# Patient Record
Sex: Male | Born: 1965 | Race: White | Hispanic: No | Marital: Married | Smoking: Former smoker
Health system: Southern US, Community
[De-identification: ages and names within clinical notes are randomized; demographics above are authoritative.]

## PROBLEM LIST (undated history)

## (undated) DIAGNOSIS — IMO0002 Reserved for concepts with insufficient information to code with codable children: Secondary | ICD-10-CM

## (undated) DIAGNOSIS — R030 Elevated blood-pressure reading, without diagnosis of hypertension: Secondary | ICD-10-CM

## (undated) DIAGNOSIS — S92309A Fracture of unspecified metatarsal bone(s), unspecified foot, initial encounter for closed fracture: Secondary | ICD-10-CM

## (undated) DIAGNOSIS — M419 Scoliosis, unspecified: Secondary | ICD-10-CM

## (undated) DIAGNOSIS — L0591 Pilonidal cyst without abscess: Secondary | ICD-10-CM

## (undated) HISTORY — DX: Reserved for concepts with insufficient information to code with codable children: IMO0002

## (undated) HISTORY — DX: Pilonidal cyst without abscess: L05.91

## (undated) HISTORY — DX: Scoliosis, unspecified: M41.9

## (undated) HISTORY — DX: Elevated blood-pressure reading, without diagnosis of hypertension: R03.0

## (undated) HISTORY — DX: Fracture of unspecified metatarsal bone(s), unspecified foot, initial encounter for closed fracture: S92.309A

---

## 2001-12-21 ENCOUNTER — Emergency Department (HOSPITAL_COMMUNITY): Admission: EM | Admit: 2001-12-21 | Discharge: 2001-12-21 | Payer: Self-pay | Admitting: Emergency Medicine

## 2006-05-11 LAB — HM HIV SCREENING LAB: HM HIV Screening: NEGATIVE

## 2006-05-11 LAB — HM HEPATITIS C SCREENING LAB: HM Hepatitis Screen: NEGATIVE

## 2010-08-28 ENCOUNTER — Ambulatory Visit (INDEPENDENT_AMBULATORY_CARE_PROVIDER_SITE_OTHER): Payer: BC Managed Care – PPO | Admitting: Family Medicine

## 2010-08-28 VITALS — BP 122/80 | HR 88 | Temp 98.1°F | Ht 70.0 in | Wt 229.1 lb

## 2010-08-28 DIAGNOSIS — R030 Elevated blood-pressure reading, without diagnosis of hypertension: Secondary | ICD-10-CM

## 2010-08-28 MED ORDER — SCOPOLAMINE 1 MG/3DAYS TD PT72: 1.0000 | MEDICATED_PATCH | TRANSDERMAL | Status: DC

## 2010-08-28 NOTE — Progress Notes (Signed)
Needs to est care here.  Was told BP was high at work- 140/110.  At [facility] ~130/90 and told to work on diet/weight/salt/exercise.  Had labs done at [facility].  Lost 10lbs since this all started.  Occ brief vision changes noted by patient.  "Sometimes it gets blurry."  Some fatigue, but now working 12 hour shifts.  Most of this happened after changing jobs.    Needs patch for motion sickness for fishing trip.  Has used it before w/o complication.   No CP/SOB/BLE edema.  Feeling well o/w.   PMH and SH reviewed  ROS: See HPI, otherwise noncontributory.  Meds, vitals, and allergies reviewed.   GEN: nad, alert and oriented HEENT: mucous membranes moist NECK: supple w/o LA CV: rrr PULM: ctab, no inc wob ABD: soft, +bs EXT: no edema SKIN: no acute rash Fundus wnl, eomi and perrl

## 2010-08-28 NOTE — Patient Instructions (Addendum)
Use the patch for motion sickness. Check the DASH diet for ideas on salt substitution.   Get a cuff to check your BP at home in the mornings.  [employer] makes good cuffs.   I'll get your records and send notice.   If the vision changes continue, then talk to an eye clinic. Take care.

## 2010-08-29 ENCOUNTER — Encounter: Payer: Self-pay | Admitting: Family Medicine

## 2010-08-29 MED ORDER — SCOPOLAMINE 1 MG/3DAYS TD PT72: 1.0000 | MEDICATED_PATCH | TRANSDERMAL | Status: AC

## 2010-08-29 NOTE — Assessment & Plan Note (Signed)
Request records and have patient check on DASH diet.  He doesn't have HTN as of this visit.  He agrees with plan.  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

## 2010-09-11 ENCOUNTER — Telehealth: Payer: Self-pay | Admitting: Family Medicine

## 2010-09-11 NOTE — Telephone Encounter (Signed)
Please call pt.  Records for Donald Kelley came in.  His cholesterol and other labs were okay for now.  Goal is treatment of BP/weight/cholesterol with diet/exercise/weight loss.  The DASH diet we talked about should help.  I would recheck labs next year at a physical.  Let me know if BP stays up in the meantime.  Thanks.

## 2010-09-16 NOTE — Telephone Encounter (Signed)
Patient advised.

## 2011-10-19 ENCOUNTER — Telehealth: Payer: Self-pay | Admitting: Family Medicine

## 2011-10-19 ENCOUNTER — Ambulatory Visit (INDEPENDENT_AMBULATORY_CARE_PROVIDER_SITE_OTHER): Payer: 59 | Admitting: Family Medicine

## 2011-10-19 ENCOUNTER — Encounter: Payer: Self-pay | Admitting: Family Medicine

## 2011-10-19 VITALS — BP 112/70 | HR 60 | Temp 98.6°F | Wt 225.8 lb

## 2011-10-19 DIAGNOSIS — M549 Dorsalgia, unspecified: Secondary | ICD-10-CM | POA: Insufficient documentation

## 2011-10-19 MED ORDER — IBUPROFEN 800 MG PO TABS: 800.0000 mg | ORAL_TABLET | Freq: Three times a day (TID) | ORAL | Status: AC | PRN

## 2011-10-19 MED ORDER — TRAMADOL HCL 50 MG PO TABS: 50.0000 mg | ORAL_TABLET | Freq: Three times a day (TID) | ORAL | Status: AC | PRN

## 2011-10-19 NOTE — Patient Instructions (Signed)
Take ibuprofen and tramadol for pain.  Tramadol can make you drowsy.  Take ibuprofen with food.  This should gradually get better.  Stretch your back as we discussed.

## 2011-10-19 NOTE — Assessment & Plan Note (Signed)
Benign exam w/o radicular sx or weakness.  Would stretch and use ibuprofen/tramadol with GI/sedation cautions.  F/u prn.  No indication to image at this point.

## 2011-10-19 NOTE — Telephone Encounter (Signed)
Caller: Donald Kelley/Patient; PCP: Sunnie Nielsen); CB#: [phone];  Call regarding starting with Back Pain onset 10/17/11 aching before bed and worse when he woke on 10/18/11. Pinching sharp Pain in lower back that radiates to his buttocks and legs with movement. He is not sure what he did to hurt it. Was not able to get in his truck this morning to go to work-10/19/11. Took 800 mgs of ibuprofen and eased pain slightly. Triage and Care advice per Back Sx Protocol and appnt scheduled for 1415 10/19/11 with Dr. Para March.

## 2011-10-19 NOTE — Telephone Encounter (Signed)
Noted  

## 2011-10-19 NOTE — Progress Notes (Signed)
Back pain.  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  Later that night he was sitting in low beach chair. He felt a pinch in his back Saturday night; worse Sunday.  Took 800mg  ibuprofen with some relief.  Out of work today, he had trouble climbing in the truck.  This has happened before, but not recently.   Pain is just below the belt in midline on the back.  Some episodic L leg pain with turning but not as much pain as in the back.  He was better laying down with a pillow between his knees. Worse sitting and standing.  No FCNAVD.  No weakness in legs.  No changes in bowels/bladder.   Meds, vitals, and allergies reviewed.   ROS: See HPI.  Otherwise, noncontributory.  nad ncat Walks slowly due to pain but w/o limp Upper back not ttp Lower back midline is painful but not on palpation.  Paraspinal muscles not ttp Pain with waist flexion and extension No weakness or change in sensation to BLE, DTRs symmetric No rash

## 2012-02-14 ENCOUNTER — Emergency Department: Payer: Self-pay | Admitting: Emergency Medicine

## 2016-02-06 ENCOUNTER — Ambulatory Visit (INDEPENDENT_AMBULATORY_CARE_PROVIDER_SITE_OTHER): Payer: 59 | Admitting: Family Medicine

## 2016-02-06 ENCOUNTER — Encounter: Payer: Self-pay | Admitting: Family Medicine

## 2016-02-06 ENCOUNTER — Ambulatory Visit (INDEPENDENT_AMBULATORY_CARE_PROVIDER_SITE_OTHER)
Admission: RE | Admit: 2016-02-06 | Discharge: 2016-02-06 | Disposition: A | Discharge status: Home / Self Care | Payer: 59 | Source: Ambulatory Visit | Attending: Family Medicine | Admitting: Family Medicine

## 2016-02-06 VITALS — BP 132/86 | HR 64 | Temp 98.4°F | Wt 232.8 lb

## 2016-02-06 DIAGNOSIS — R05 Cough: Secondary | ICD-10-CM | POA: Diagnosis not present

## 2016-02-06 DIAGNOSIS — R0789 Other chest pain: Secondary | ICD-10-CM

## 2016-02-06 DIAGNOSIS — R059 Cough, unspecified: Secondary | ICD-10-CM

## 2016-02-06 MED ORDER — ALBUTEROL SULFATE HFA 108 (90 BASE) MCG/ACT IN AERS: 2.0000 | INHALATION_SPRAY | Freq: Four times a day (QID) | RESPIRATORY_TRACT | 0 refills | Status: DC | PRN

## 2016-02-06 MED ORDER — RANITIDINE HCL 150 MG PO TABS: 150.0000 mg | ORAL_TABLET | Freq: Two times a day (BID) | ORAL | 1 refills | Status: DC

## 2016-02-06 MED ORDER — ALBUTEROL SULFATE (2.5 MG/3ML) 0.083% IN NEBU
2.5000 mg | INHALATION_SOLUTION | Freq: Once | RESPIRATORY_TRACT | Status: AC
  Administered 2016-02-06: 2.5 mg via RESPIRATORY_TRACT

## 2016-02-06 NOTE — Assessment & Plan Note (Signed)
Cough has been going on in the morning for the last few months. This coincides with increasing heartburn. The two may be related. Now with more recent chest tightness. No exertional symptoms. I do not suspect that he has a cardiac process causing the chest tightness. 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  Discussed with patient. He is improved after using albuterol here in the clinic. Start Zantac twice a day. Use albuterol inhaler as needed. Check chest x-ray. See notes on imaging. He will update me as needed. See after visit summary. Okay for outpatient f/u. >30 minutes spent in face to face time with patient, >50% spent in counselling or coordination of care.

## 2016-02-06 NOTE — Patient Instructions (Signed)
Go to the lab on the way out.  We'll contact you with your xray report. Start zantac 150mg  twice a day for heartburn.  Use inhaler as needed.   Update me in a few days.  Take care.  Glad to see you.

## 2016-02-06 NOTE — Progress Notes (Signed)
Pre visit review using our clinic review tool, if applicable. No additional management support is needed unless otherwise documented below in the visit note. 

## 2016-02-06 NOTE — Progress Notes (Signed)
Sx started a few months ago.  Usually with AM cough.  As the morning goes on, he would usually improve.  In the last week or so his sx changed, after a vacation.  Recently with tightness in chest, but not exertional.  No sputum.  No FCNAVD.  He doesn't feel sick.  Some indigestion resolved with TUMS.  He paints cars for a living.  No known wheeze.  Some rhinorrhea.  No ear pain usually unless he valsalvas and that is at baseline over the last few years.  He has no ST.  More dust exposure recently.    Cough worse in the last few months, concurrently with inc in GERD/heartburn.   PMH and SH reviewed  ROS: Per HPI unless specifically indicated in ROS section   Meds, vitals, and allergies reviewed.   GEN: nad, alert and oriented HEENT: mucous membranes moist, tm w/o erythema, nasal exam w/o erythema, clear discharge noted,  OP with cobblestoning NECK: supple w/o LA CV: rrr.   PULM: ctab, no inc wob EXT: no edema SKIN: no acute rash  Recheck after SABA use: no wheeze, ctab, and chest doesn't feel tight.

## 2016-02-07 ENCOUNTER — Telehealth: Payer: Self-pay | Admitting: Family Medicine

## 2016-02-07 NOTE — Telephone Encounter (Signed)
Pt returned call - he states to leave a detailed message, as he is hard to get a hold of.

## 2016-02-07 NOTE — Telephone Encounter (Signed)
Refer to lab notes. Thank you.

## 2016-04-15 ENCOUNTER — Other Ambulatory Visit: Payer: Self-pay | Admitting: Family Medicine

## 2016-09-21 ENCOUNTER — Other Ambulatory Visit: Payer: Self-pay

## 2016-09-21 NOTE — Telephone Encounter (Signed)
Pt is going on deep [place] fishing trip starting 09/22/16 for one week. Pt request refill transderm scope to [facility]. Pt last seen 01/27/16 and rx last refilled # 4 x 1 2012.

## 2016-09-22 MED ORDER — SCOPOLAMINE 1 MG/3DAYS TD PT72: 1.0000 | MEDICATED_PATCH | TRANSDERMAL | 1 refills | Status: DC

## 2016-09-22 NOTE — Telephone Encounter (Signed)
Left message on voicemail for patient to call back. 

## 2016-09-22 NOTE — Telephone Encounter (Signed)
Patient notified by telephone that script has been sent to the pharmacy. 

## 2016-09-22 NOTE — Telephone Encounter (Signed)
Sent. Thanks.   

## 2017-02-10 ENCOUNTER — Encounter: Payer: Self-pay | Admitting: Family Medicine

## 2017-02-10 ENCOUNTER — Ambulatory Visit (INDEPENDENT_AMBULATORY_CARE_PROVIDER_SITE_OTHER): Payer: 59 | Admitting: Family Medicine

## 2017-02-10 VITALS — BP 142/88 | HR 67 | Temp 98.2°F | Wt 234.0 lb

## 2017-02-10 DIAGNOSIS — K30 Functional dyspepsia: Secondary | ICD-10-CM | POA: Diagnosis not present

## 2017-02-10 DIAGNOSIS — Z13220 Encounter for screening for lipoid disorders: Secondary | ICD-10-CM

## 2017-02-10 DIAGNOSIS — R030 Elevated blood-pressure reading, without diagnosis of hypertension: Secondary | ICD-10-CM

## 2017-02-10 MED ORDER — OMEPRAZOLE 40 MG PO CPDR: 40.0000 mg | DELAYED_RELEASE_CAPSULE | Freq: Every day | ORAL | 3 refills | Status: DC

## 2017-02-10 NOTE — Assessment & Plan Note (Signed)
Progressively worsening over last few months, along with noted weight gain.  I did recommend he start PPI daily over next 3 wks then to use PRN. If persistent symptoms despite PPI, would consider GI eval. Pt agrees with plan.

## 2017-02-10 NOTE — Assessment & Plan Note (Signed)
bp borderline to stable today, likely elevated yesterday due to malaise, anxiousness. Reviewed optimal setting for BP check (waiting 30 min between any food, drink, exercise, anxiety, anger, pain, caffeine - and bp check). I don't see acute need for blood pressure medication at this time but did recommend decreased salt intake and slow increased aerobic exercise. DASH diet handout provided today. He will also start checking blood pressure once weekly at local pharmacy and let us know if persistently elevated. Low threshold to start med if BP consistently elevated upon reliable checks.

## 2017-02-10 NOTE — Patient Instructions (Addendum)
For reflux symptoms - start omeprazole  daily for 3 weeks then as needed.  Blood pressure stable today. No medicines for now.  Labs today. Your goal blood pressure is <140/90. Work on low salt/sodium diet - goal <1.5gm (1,500mg ) per day. Eat a diet high in fruits/vegetables and whole grains.  Look into mediterranean and DASH diet. Goal activity is 153min/wk of moderate intensity exercise. This can be split into 30 minute chunks.  If you are not at this level, you can start with smaller 10-15 min increments and slowly build up activity. Return in 1-2 months with Dr Para March for physical.  DASH Eating Plan DASH stands for "Dietary Approaches to Stop Hypertension." The DASH eating plan is a healthy eating plan that has been shown to reduce high blood pressure (hypertension). It may also reduce your risk for type 2 diabetes, heart disease, and stroke. The DASH eating plan may also help with weight loss. What are tips for following this plan? General guidelines  Avoid eating more than 2,300 mg (milligrams) of salt (sodium) a day. If you have hypertension, you may need to reduce your sodium intake to 1,500 mg a day.  Limit alcohol intake to no more than 1 drink a day for nonpregnant women and 2 drinks a day for men. One drink equals 12 oz of beer, 5 oz of wine, or 1 oz of hard liquor.  Work with your health care provider to maintain a healthy body weight or to lose weight. Ask what an ideal weight is for you.  Get at least 30 minutes of exercise that causes your heart to beat faster (aerobic exercise) most days of the week. Activities may include walking, swimming, or biking.  Work with your health care provider or diet and nutrition specialist (dietitian) to adjust your eating plan to your individual calorie needs. Reading food labels  Check food labels for the amount of sodium per serving. Choose foods with less than 5 percent of the Daily Value of sodium. Generally, foods with less than 300  mg of sodium per serving fit into this eating plan.  To find whole grains, look for the word "whole" as the first word in the ingredient list. Shopping  Buy products labeled as "low-sodium" or "no salt added."  Buy fresh foods. Avoid canned foods and premade or frozen meals. Cooking  Avoid adding salt when cooking. Use salt-free seasonings or herbs instead of table salt or sea salt. Check with your health care provider or pharmacist before using salt substitutes.  Do not fry foods. Cook foods using healthy methods such as baking, boiling, grilling, and broiling instead.  Cook with heart-healthy oils, such as olive, canola, soybean, or sunflower oil. Meal planning   Eat a balanced diet that includes: ? 5 or more servings of fruits and vegetables each day. At each meal, try to fill half of your plate with fruits and vegetables. ? Up to 6-8 servings of whole grains each day. ? Less than 6 oz of lean meat, poultry, or fish each day. A 3-oz serving of meat is about the same size as a deck of cards. One egg equals 1 oz. ? 2 servings of low-fat dairy each day. ? A serving of nuts, seeds, or beans 5 times each week. ? Heart-healthy fats. Healthy fats called Omega-3 fatty acids are found in foods such as flaxseeds and coldwater fish, like sardines, salmon, and mackerel.  Limit how much you eat of the following: ? Canned or prepackaged foods. ? Food  that is high in trans fat, such as fried foods. ? Food that is high in saturated fat, such as fatty meat. ? Sweets, desserts, sugary drinks, and other foods with added sugar. ? Full-fat dairy products.  Do not salt foods before eating.  Try to eat at least 2 vegetarian meals each week.  Eat more home-cooked food and less restaurant, buffet, and fast food.  When eating at a restaurant, ask that your food be prepared with less salt or no salt, if possible. What foods are recommended? The items listed may not be a complete list. Talk with your  dietitian about what dietary choices are best for you. Grains Whole-grain or whole-wheat bread. Whole-grain or whole-wheat pasta. Brown rice. [place]. Bulgur. Whole-grain and low-sodium cereals. Pita bread. Low-fat, low-sodium crackers. Whole-wheat flour tortillas. Vegetables Fresh or frozen vegetables (raw, steamed, roasted, or grilled). Low-sodium or reduced-sodium tomato and vegetable juice. Low-sodium or reduced-sodium tomato sauce and tomato paste. Low-sodium or reduced-sodium canned vegetables. Fruits All fresh, dried, or frozen fruit. Canned fruit in natural juice (without added sugar). Meat and other protein foods Skinless chicken or [place]. Ground chicken or [place]. Pork with fat trimmed off. Fish and seafood. Egg whites. Dried beans, peas, or lentils. Unsalted nuts, nut butters, and seeds. Unsalted canned beans. Lean cuts of beef with fat trimmed off. Low-sodium, lean deli meat. Dairy Low-fat (1%) or fat-free (skim) milk. Fat-free, low-fat, or reduced-fat cheeses. Nonfat, low-sodium ricotta or cottage cheese. Low-fat or nonfat yogurt. Low-fat, low-sodium cheese. Fats and oils Soft margarine without trans fats. Vegetable oil. Low-fat, reduced-fat, or light mayonnaise and salad dressings (reduced-sodium). Canola, safflower, olive, soybean, and sunflower oils. Avocado. Seasoning and other foods Herbs. Spices. Seasoning mixes without salt. Unsalted popcorn and pretzels. Fat-free sweets. What foods are not recommended? The items listed may not be a complete list. Talk with your dietitian about what dietary choices are best for you. Grains Baked goods made with fat, such as croissants, muffins, or some breads. Dry pasta or rice meal packs. Vegetables Creamed or fried vegetables. Vegetables in a cheese sauce. Regular canned vegetables (not low-sodium or reduced-sodium). Regular canned tomato sauce and paste (not low-sodium or reduced-sodium). Regular tomato and vegetable juice (not  low-sodium or reduced-sodium). Angie Fava. Olives. Fruits Canned fruit in a light or heavy syrup. Fried fruit. Fruit in cream or butter sauce. Meat and other protein foods Fatty cuts of meat. Ribs. Fried meat. Berniece Salines. Sausage. Bologna and other processed lunch meats. Salami. Fatback. Hotdogs. Bratwurst. Salted nuts and seeds. Canned beans with added salt. Canned or smoked fish. Whole eggs or egg yolks. Chicken or [place] with skin. Dairy Whole or 2% milk, cream, and half-and-half. Whole or full-fat cream cheese. Whole-fat or sweetened yogurt. Full-fat cheese. Nondairy creamers. Whipped toppings. Processed cheese and cheese spreads. Fats and oils Butter. Stick margarine. Lard. Shortening. Ghee. Bacon fat. Tropical oils, such as coconut, palm kernel, or palm oil. Seasoning and other foods Salted popcorn and pretzels. Onion salt, garlic salt, seasoned salt, table salt, and sea salt. Worcestershire sauce. Tartar sauce. Barbecue sauce. Teriyaki sauce. Soy sauce, including reduced-sodium. Steak sauce. Canned and packaged gravies. Fish sauce. Oyster sauce. Cocktail sauce. Horseradish that you find on the shelf. Ketchup. Mustard. Meat flavorings and tenderizers. Bouillon cubes. Hot sauce and Tabasco sauce. Premade or packaged marinades. Premade or packaged taco seasonings. Relishes. Regular salad dressings. Where to find more information:  National Heart, Lung, and [place]: https://wilson-eaton.com/  American Heart Association: www.heart.org Summary  The DASH eating plan is a  healthy eating plan that has been shown to reduce high blood pressure (hypertension). It may also reduce your risk for type 2 diabetes, heart disease, and stroke.  With the DASH eating plan, you should limit salt (sodium) intake to 2,300 mg a day. If you have hypertension, you may need to reduce your sodium intake to 1,500 mg a day.  When on the DASH eating plan, aim to eat more fresh fruits and vegetables, whole grains, lean proteins,  low-fat dairy, and heart-healthy fats.  Work with your health care provider or diet and nutrition specialist (dietitian) to adjust your eating plan to your individual calorie needs. This information is not intended to replace advice given to you by your health care provider. Make sure you discuss any questions you have with your health care provider. Document Released: 04/16/2011 Document Revised: 04/20/2016 Document Reviewed: 04/20/2016 Elsevier Interactive Patient Education  2017 [employer].

## 2017-02-10 NOTE — Progress Notes (Signed)
BP (!) 142/88 (BP Location: Left Arm, Cuff Size: Large)   Pulse 67   Temp 98.2 F (36.8 C) (Oral)   Wt 234 lb (106.1 kg)   SpO2 97%   BMI 33.58 kg/m    CC: hypertension Subjective:    Patient ID: Barbarann Ehlers, male    DOB: Oct 26, 1965, 51 y.o.   MRN: [id number]  HPI: WAQAS BRUHL is a 51 y.o. male presenting on 02/10/2017 for BP check (C/o blurred vision yesterday. 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 Yesterday had chicken leg, sausage biscuit for lunch. Today having bilateral temporal headache. Blurry vision has resolved. Denies chest pain or tightness, leg swelling.   Ongoing indigestion worse over the last 1-2 months - has tried zantac for this which helped some but he self stopped. Drinking pepsi also seems to help. Notes he gets full much quicker than previously (after 1-2 beers).   BP has been as high as 200/110 at work checks, but on recheck it has come down to normal.   No known fmhx CAD. No known DM in family.   Weight has been increasing. Likely increased salt intake recently.  Long term tobacco chewer.   Relevant past medical, surgical, family and social history reviewed and updated as indicated. Interim medical history since our last visit reviewed. Allergies and medications reviewed and updated. Outpatient Medications Prior to Visit  Medication Sig Dispense Refill  . albuterol (PROVENTIL HFA;VENTOLIN HFA) 108 (90 Base) MCG/ACT inhaler Inhale 2 puffs into the lungs every 6 (six) hours as needed (for chest tightness). Okay to fill with proair/ventolin/albuterol 1 Inhaler 0  . ranitidine (ZANTAC) 150 MG tablet TAKE 1 TABLET BY MOUTH TWICE A DAY 60 tablet 1  . scopolamine (TRANSDERM-SCOP, 1.5 MG,) 1 MG/3DAYS Place 1 patch (1.5 mg total)  onto the skin every 3 (three) days. 4 patch 1   No facility-administered medications prior to visit.      Per HPI unless specifically indicated in ROS section below Review of Systems     Objective:    BP (!) 142/88 (BP Location: Left Arm, Cuff Size: Large)   Pulse 67   Temp 98.2 F (36.8 C) (Oral)   Wt 234 lb (106.1 kg)   SpO2 97%   BMI 33.58 kg/m   Wt Readings from Last 3 Encounters:  02/10/17 234 lb (106.1 kg)  02/06/16 232 lb 12 oz (105.6 kg)  10/19/11 225 lb 12 oz (102.4 kg)    Physical Exam  Constitutional: He appears well-developed and well-nourished. No distress.  HENT:  Mouth/Throat: Oropharynx is clear and moist. No oropharyngeal exudate.  Eyes: Pupils are equal, round, and reactive to light. Conjunctivae and EOM are normal. No scleral icterus.  Neck: Normal range of motion. Neck supple.  Cardiovascular: Normal rate, regular rhythm, normal heart sounds and intact distal pulses.   No murmur heard. Pulmonary/Chest: Effort normal and breath sounds normal. No respiratory distress. He has no wheezes. He has no rales.  Musculoskeletal: He exhibits no edema.  Skin: Skin is warm and dry. No rash noted.  Nursing note and vitals reviewed.  No results found for this or any previous visit.  No results found for: CHOL, HDL, LDLCALC, LDLDIRECT, TRIG, CHOLHDL     Assessment & Plan:  Check labs today, return for CPE with PCP over next 1-2 months (overdue) Problem List Items Addressed This Visit    Acid indigestion    Progressively worsening over last few months, along with noted weight gain.  I did recommend he start PPI daily over next 3 wks then to use PRN. If persistent symptoms despite PPI, would consider GI eval. Pt agrees with plan.       Relevant Orders   Comprehensive metabolic panel   TSH   Elevated blood pressure reading - Primary    bp borderline to stable today, likely elevated yesterday due to malaise, anxiousness. Reviewed optimal setting for BP check  (waiting 30 min between any food, drink, exercise, anxiety, anger, pain, caffeine - and bp check). I don't see acute need for blood pressure medication at this time but did recommend decreased salt intake and slow increased aerobic exercise. DASH diet handout provided today. He will also start checking blood pressure once weekly at local pharmacy and let us know if persistently elevated. Low threshold to start med if BP consistently elevated upon reliable checks.       Relevant Orders   Comprehensive metabolic panel   TSH    Other Visit Diagnoses    Lipid screening       Relevant Orders   Lipid panel       Follow up plan: Return in about 2 months (around 04/12/2017) for annual exam, prior fasting for blood work.  Eustaquio Boyden, MD

## 2017-02-11 LAB — COMPREHENSIVE METABOLIC PANEL
ALBUMIN: 4.6 g/dL (ref 3.5–5.2)
ALK PHOS: 68 U/L (ref 39–117)
ALT: 22 U/L (ref 0–53)
AST: 15 U/L (ref 0–37)
BILIRUBIN TOTAL: 0.4 mg/dL (ref 0.2–1.2)
BUN: 17 mg/dL (ref 6–23)
CO2: 29 mEq/L (ref 19–32)
Calcium: 9.8 mg/dL (ref 8.4–10.5)
Chloride: 104 mEq/L (ref 96–112)
Creatinine, Ser: 1.11 mg/dL (ref 0.40–1.50)
GFR: 74.47 mL/min (ref >60.00)
Glucose, Bld: 93 mg/dL (ref 70–99)
POTASSIUM: 5.1 meq/L (ref 3.5–5.1)
Sodium: 141 mEq/L (ref 135–145)
TOTAL PROTEIN: 7.8 g/dL (ref 6.0–8.3)

## 2017-02-11 LAB — LIPID PANEL
CHOLESTEROL: 201 mg/dL — AB (ref 0–200)
HDL: 36.5 mg/dL — AB (ref >39.00)
LDL Cholesterol: 129 mg/dL — ABNORMAL HIGH (ref 0–99)
NonHDL: 164.34
TRIGLYCERIDES: 177 mg/dL — AB (ref 0.0–149.0)
Total CHOL/HDL Ratio: 6
VLDL: 35.4 mg/dL (ref 0.0–40.0)

## 2017-02-11 LAB — TSH: TSH: 1.83 u[IU]/mL (ref 0.35–4.50)

## 2017-02-16 ENCOUNTER — Telehealth: Payer: Self-pay | Admitting: Family Medicine

## 2017-02-16 NOTE — Telephone Encounter (Signed)
See result note. plz notify labs overall ok - chol levels a bit high. rec low chol diet. May mail handout if he doesn't have one.  How have blood pressures been running?

## 2017-02-16 NOTE — Telephone Encounter (Signed)
PT called concerning 10/3 labs. He is requesting a cb when results are released.

## 2017-02-17 NOTE — Telephone Encounter (Signed)
Attempted to contact pt. No answer, no dpr. Left message on vm to cb.

## 2017-02-17 NOTE — Telephone Encounter (Signed)
Return call from pt. I relayed message per Dr. Reece Agar. States he has not checked BP since OV.

## 2017-03-29 ENCOUNTER — Encounter: Payer: 59 | Admitting: Family Medicine

## 2017-04-15 ENCOUNTER — Ambulatory Visit (INDEPENDENT_AMBULATORY_CARE_PROVIDER_SITE_OTHER): Payer: 59 | Admitting: Family Medicine

## 2017-04-15 ENCOUNTER — Telehealth: Payer: Self-pay | Admitting: Family Medicine

## 2017-04-15 ENCOUNTER — Encounter: Payer: Self-pay | Admitting: Family Medicine

## 2017-04-15 VITALS — BP 122/70 | HR 61 | Temp 97.5°F | Ht 70.0 in | Wt 218.5 lb

## 2017-04-15 DIAGNOSIS — Z23 Encounter for immunization: Secondary | ICD-10-CM | POA: Diagnosis not present

## 2017-04-15 DIAGNOSIS — Z0000 Encounter for general adult medical examination without abnormal findings: Secondary | ICD-10-CM

## 2017-04-15 DIAGNOSIS — Z7189 Other specified counseling: Secondary | ICD-10-CM

## 2017-04-15 NOTE — Patient Instructions (Addendum)
Check with your insurance to see if they will cover the cologuard test.  Let us know either way.   Update me as needed.   Thanks for your effort.  Take care.  Glad to see you.

## 2017-04-15 NOTE — Telephone Encounter (Signed)
Copied from CRM [phone]. Topic: Quick Communication - Patient Running Late >> Apr 15, 2017  3:30 PM Stephannie LiSimmons, Keyler Hoge L, NT wrote: Patient called and is running 15 minutes late.patient made aware of the 10 minute window  Route to department's PEC pool.

## 2017-04-15 NOTE — Progress Notes (Signed)
CPE- See plan.  Routine anticipatory guidance given to patient.  See health maintenance.  The possibility exists that previously documented standard health maintenance information may have been brought forward from a previous encounter into this note.  If needed, that same information has been updated to reflect the current situation based on today's encounter.    Tetanus likely <10 years per patient report.  Flu 2018.   PNA and shingles not due D/w patient QI:[id number]:options for colon cancer screening, including IFOB vs. colonoscopy.  Risks and benefits of both were discussed and patient voiced understanding.  Pt elects DGL:OVFIEPPIRfor:cologuard.  Prostate cancer screening and PSA options (with potential risks and benefits of testing vs not testing) were discussed along with recent recs/guidelines.  He declined testing PSA at this point. Living will d/w pt.  Wife designated if patient were incapacitated.   HIV and HCV screening d/w pt.  [facility].   Dip tobacco cessation d/w pt, encouraged.  BP wnl today.  Intentional weight loss with diet and exercise.    PMH and SH reviewed  Meds, vitals, and allergies reviewed.   ROS: Per HPI.  Unless specifically indicated otherwise in HPI, the patient denies:  General: fever. Eyes: acute vision changes ENT: sore throat Cardiovascular: chest pain Respiratory: SOB GI: vomiting GU: dysuria Musculoskeletal: acute back pain Derm: acute rash Neuro: acute motor dysfunction Psych: worsening mood Endocrine: polydipsia Heme: bleeding Allergy: hayfever  GEN: nad, alert and oriented HEENT: mucous membranes moist NECK: supple w/o LA CV: rrr. PULM: ctab, no inc wob ABD: soft, +bs EXT: no edema SKIN: no acute rash but irritated lesion at insect/hornet sting between the eyebrows.

## 2017-04-15 NOTE — Telephone Encounter (Signed)
Pt has 4pm appt

## 2017-04-16 DIAGNOSIS — Z7189 Other specified counseling: Secondary | ICD-10-CM | POA: Insufficient documentation

## 2017-04-16 DIAGNOSIS — Z0000 Encounter for general adult medical examination without abnormal findings: Secondary | ICD-10-CM | POA: Insufficient documentation

## 2017-04-16 NOTE — Assessment & Plan Note (Signed)
Living will d/w pt.  Wife designated if patient were incapacitated.   ?

## 2017-04-16 NOTE — Assessment & Plan Note (Addendum)
Tetanus likely <10 years per patient report.  Flu 2018.   PNA and shingles not due D/w patient ZO:XWRUEAVre:options for colon cancer screening, including IFOB vs. colonoscopy.  Risks and benefits of both were discussed and patient voiced understanding.  Pt elects WUJ:[id number]:cologuard.  Prostate cancer screening and PSA options (with potential risks and benefits of testing vs not testing) were discussed along with recent recs/guidelines.  He declined testing PSA at this point. Living will d/w pt.  Wife designated if patient were incapacitated.   HIV and HCV screening d/w pt.  [facility].   Dip tobacco cessation d/w pt, encouraged.  BP wnl today.  Intentional weight loss with diet and exercise.   BP wnl and lipids have likely improved since last check with diet exercise and weight loss.  No need to recheck labs today.

## 2017-08-24 IMAGING — DX DG CHEST 2V
2 series · 2 of 2 positions shown · non-contrast
Comparison: None in PACs

CLINICAL DATA: Chest tightness, former smoker. History of
hypertension.

EXAM:
CHEST  2 VIEW

[chest pa]
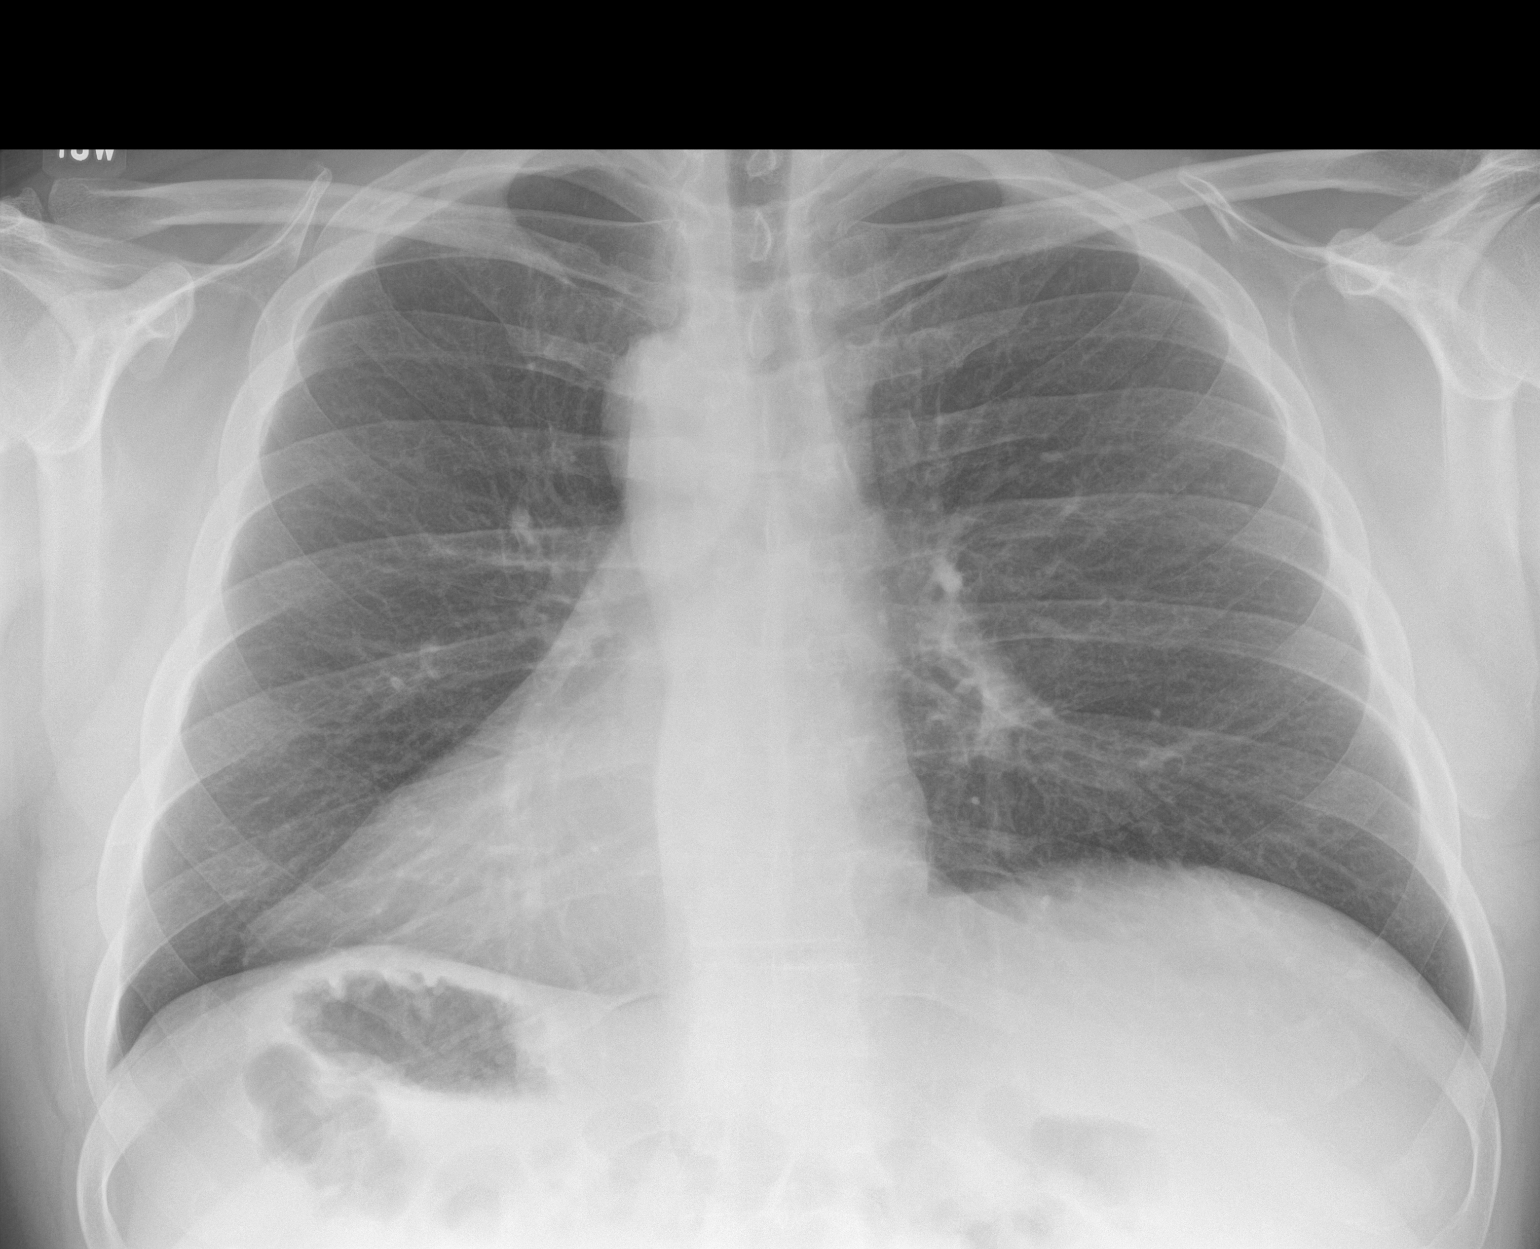

[chest lat]
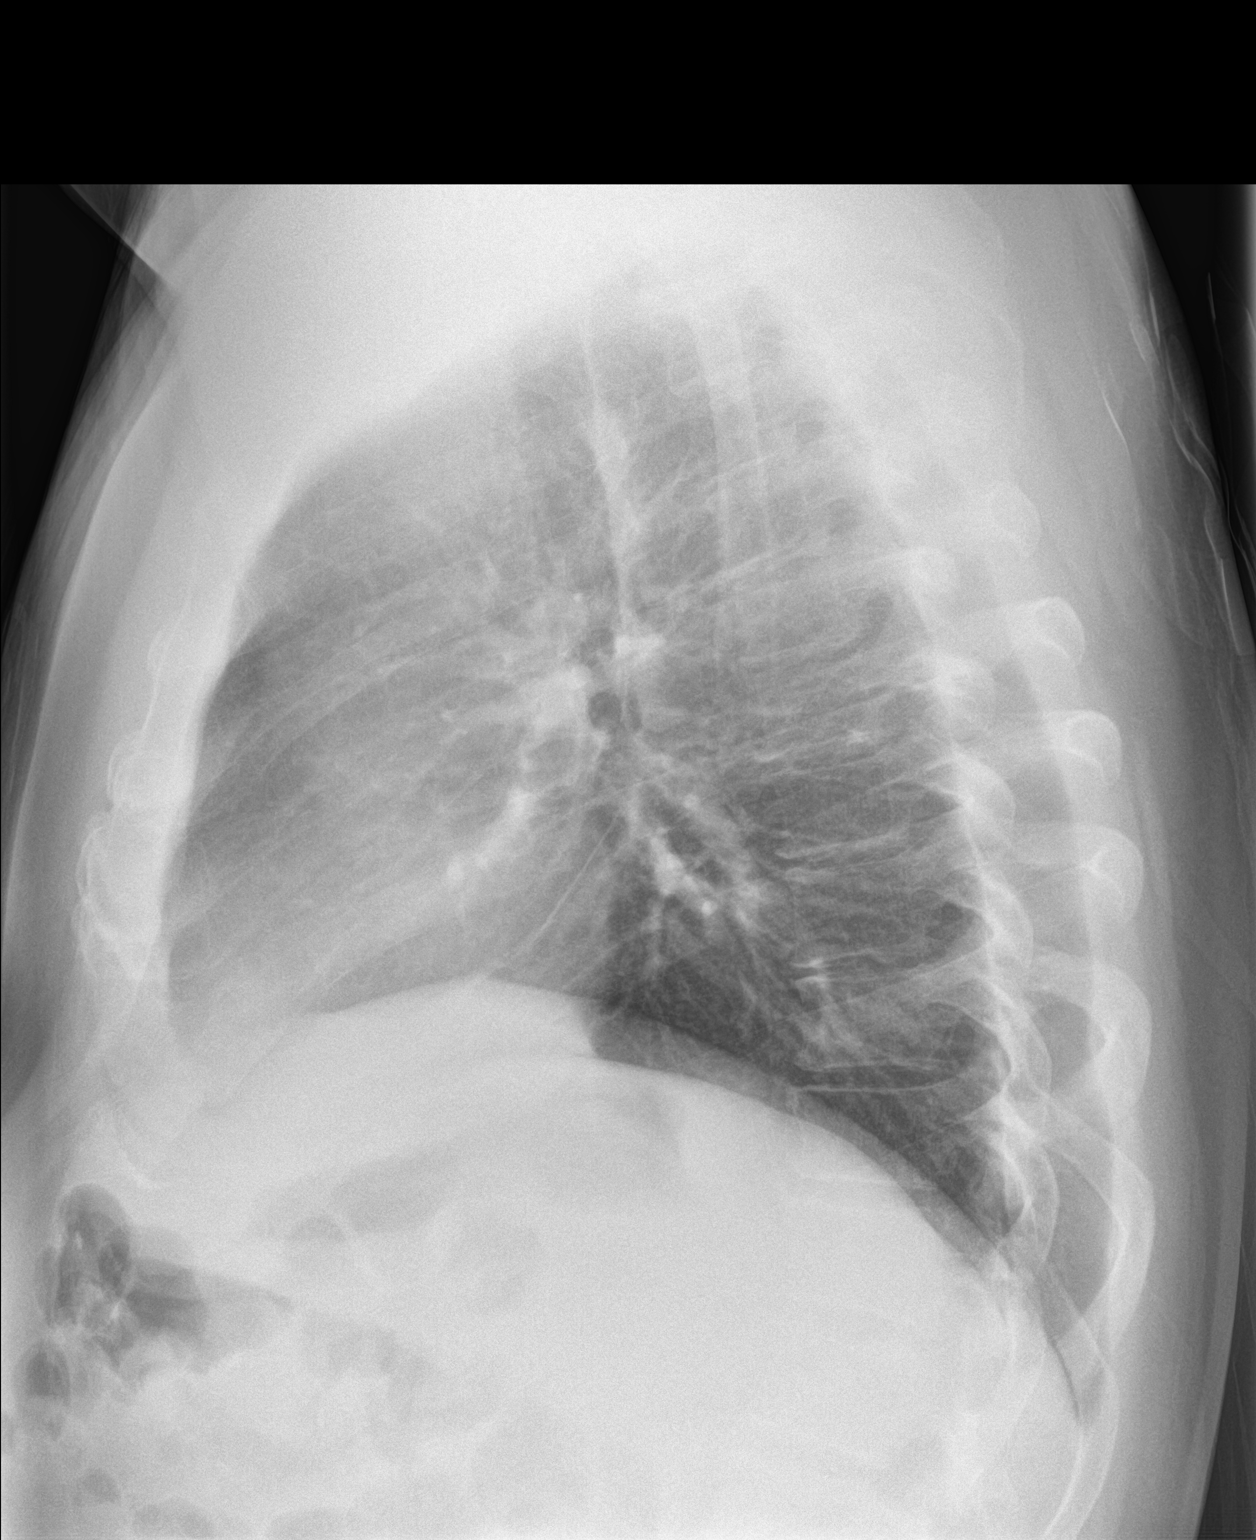

[2 of 2 positions shown; findings below may reference images not displayed]

FINDINGS: The lungs are reasonably well inflated. There is no focal
infiltrate. There is no pleural effusion. The heart and pulmonary
vascularity are normal. The mediastinum is normal in width. The
trachea is midline. The bony thorax exhibits no acute abnormality.
IMPRESSION: There is no active cardiopulmonary disease.

## 2022-09-30 ENCOUNTER — Telehealth: Payer: Self-pay | Admitting: General Practice

## 2022-09-30 NOTE — Telephone Encounter (Signed)
Patient scheduled.

## 2022-09-30 NOTE — Telephone Encounter (Signed)
Patient contacted the office and was requesting to re establish care with Dr. Para March. States he had seen Para March as pcp pre-covid and really enjoyed him. Would like to know if it is okay per him to re establish care. Please advise, thank you!

## 2022-09-30 NOTE — Telephone Encounter (Signed)
Please, yes, thanks.  

## 2023-01-12 ENCOUNTER — Ambulatory Visit (INDEPENDENT_AMBULATORY_CARE_PROVIDER_SITE_OTHER)
Admission: RE | Admit: 2023-01-12 | Discharge: 2023-01-12 | Disposition: A | Discharge status: Home / Self Care | Payer: 59 | Source: Ambulatory Visit | Attending: Family Medicine | Admitting: Family Medicine

## 2023-01-12 ENCOUNTER — Ambulatory Visit: Payer: 59 | Admitting: Family Medicine

## 2023-01-12 ENCOUNTER — Encounter: Payer: Self-pay | Admitting: Family Medicine

## 2023-01-12 VITALS — BP 130/78 | HR 66 | Temp 97.6°F | Ht 70.0 in | Wt 243.0 lb

## 2023-01-12 DIAGNOSIS — R0602 Shortness of breath: Secondary | ICD-10-CM | POA: Diagnosis not present

## 2023-01-12 DIAGNOSIS — Z23 Encounter for immunization: Secondary | ICD-10-CM

## 2023-01-12 DIAGNOSIS — Z0001 Encounter for general adult medical examination with abnormal findings: Secondary | ICD-10-CM | POA: Diagnosis not present

## 2023-01-12 DIAGNOSIS — E785 Hyperlipidemia, unspecified: Secondary | ICD-10-CM

## 2023-01-12 DIAGNOSIS — Z7189 Other specified counseling: Secondary | ICD-10-CM

## 2023-01-12 DIAGNOSIS — Z0000 Encounter for general adult medical examination without abnormal findings: Secondary | ICD-10-CM

## 2023-01-12 DIAGNOSIS — Z1211 Encounter for screening for malignant neoplasm of colon: Secondary | ICD-10-CM

## 2023-01-12 NOTE — Progress Notes (Signed)
CPE- See plan.  Routine anticipatory guidance given to patient.  See health maintenance.  The possibility exists that previously documented standard health maintenance information may have been brought forward from a previous encounter into this note.  If needed, that same information has been updated to reflect the current situation based on today's encounter.  Tetanus 2024 Flu d/w pt.  PNA not due shingles d/w pt.   Covid prev done.   D/w patient ZO:XWRUEAV for colon cancer screening, including IFOB vs. colonoscopy.  Risks and benefits of both were discussed and patient voiced understanding.  Pt elects WUJ:[id number].  Prostate cancer screening and PSA options (with potential risks and benefits of testing vs not testing) were discussed along with recent recs/guidelines.  He declined testing PSA at this point. Living will d/w pt.  Wife designated if patient were incapacitated.   HIV and HCV screening d/w pt.  [facility].   Dip tobacco cessation d/w pt, encouraged.   He had some SOB with exertion over the last year, not at rest.   Gradually got worse.  Painting for 40 years.  Some occ wheeze when he has a concurrent illness.  No CP. No BLE edema.  Some clear phlegm in the AMs.  Some fatigue at work.  History of hyperlipidemia with recheck labs pending.  PMH and SH reviewed  Meds, vitals, and allergies reviewed.   ROS: Per HPI.  Unless specifically indicated otherwise in HPI, the patient denies:  General: fever. Eyes: acute vision changes ENT: sore throat Cardiovascular: chest pain Respiratory: SOB GI: vomiting GU: dysuria Musculoskeletal: acute back pain Derm: acute rash Neuro: acute motor dysfunction Psych: worsening mood Endocrine: polydipsia Heme: bleeding Allergy: hayfever  GEN: nad, alert and oriented HEENT: mucous membranes moist NECK: supple w/o LA CV: rrr. PULM: ctab, no inc wob ABD: soft, +bs EXT: no edema SKIN: no acute rash  He is going to  come back for fasting labs.  Chest x-ray done today.  EKG with sinus bradycardia but otherwise no acute changes.

## 2023-01-12 NOTE — Patient Instructions (Addendum)
Check with your insurance to see if they will cover the shingles shot. Tetanus shot today. I would get a flu shot each fall.   Ask the front for a fasting 7:30 lab visit.  Take care.  Glad to see you.  Xray on the way out.

## 2023-01-13 ENCOUNTER — Encounter: Payer: Self-pay | Admitting: Family Medicine

## 2023-01-13 DIAGNOSIS — R0602 Shortness of breath: Secondary | ICD-10-CM | POA: Insufficient documentation

## 2023-01-13 NOTE — Assessment & Plan Note (Signed)
Living will d/w pt.  Wife designated if patient were incapacitated.   ?

## 2023-01-13 NOTE — Assessment & Plan Note (Addendum)
Unclear source.  Painting history noted.  Lungs are clear.  Okay for outpatient follow-up.  He is going to come back for follow-up labs.  Check chest x-ray today.  EKG with sinus bradycardia but no acute changes otherwise and this is overall unremarkable.  At this point still okay for outpatient follow-up.  Routine cautions given to patient.

## 2023-01-13 NOTE — Assessment & Plan Note (Signed)
Tetanus 2024 Flu d/w pt.  PNA not due shingles d/w pt.   Covid prev done.   D/w patient [id number] for colon cancer screening, including IFOB vs. colonoscopy.  Risks and benefits of both were discussed and patient voiced understanding.  Pt elects [id number].  Prostate cancer screening and PSA options (with potential risks and benefits of testing vs not testing) were discussed along with recent recs/guidelines.  He declined testing PSA at this point. Living will d/w pt.  Wife designated if patient were incapacitated.   HIV and HCV screening d/w pt.  [facility].   Dip tobacco cessation d/w pt, encouraged.

## 2023-01-21 ENCOUNTER — Other Ambulatory Visit (INDEPENDENT_AMBULATORY_CARE_PROVIDER_SITE_OTHER): Payer: 59

## 2023-01-21 DIAGNOSIS — E785 Hyperlipidemia, unspecified: Secondary | ICD-10-CM

## 2023-01-21 DIAGNOSIS — R0602 Shortness of breath: Secondary | ICD-10-CM | POA: Diagnosis not present

## 2023-01-21 LAB — COMPREHENSIVE METABOLIC PANEL
ALT: 30 U/L (ref 0–53)
AST: 21 U/L (ref 0–37)
Albumin: 4.2 g/dL (ref 3.5–5.2)
Alkaline Phosphatase: 86 U/L (ref 39–117)
BUN: 15 mg/dL (ref 6–23)
CO2: 27 meq/L (ref 19–32)
Calcium: 9.3 mg/dL (ref 8.4–10.5)
Chloride: 103 meq/L (ref 96–112)
Creatinine, Ser: 1.07 mg/dL (ref 0.40–1.50)
GFR: 77.78 mL/min (ref >60.00)
Glucose, Bld: 93 mg/dL (ref 70–99)
Potassium: 4.7 meq/L (ref 3.5–5.1)
Sodium: 142 meq/L (ref 135–145)
Total Bilirubin: 0.6 mg/dL (ref 0.2–1.2)
Total Protein: 7.1 g/dL (ref 6.0–8.3)

## 2023-01-21 LAB — BRAIN NATRIURETIC PEPTIDE: Pro B Natriuretic peptide (BNP): 25 pg/mL (ref 0.0–100.0)

## 2023-01-21 LAB — CBC WITH DIFFERENTIAL/PLATELET
Basophils Absolute: 0.1 10*3/uL (ref 0.0–0.1)
Basophils Relative: 1 % (ref 0.0–3.0)
Eosinophils Absolute: 0.2 10*3/uL (ref 0.0–0.7)
Eosinophils Relative: 2.4 % (ref 0.0–5.0)
HCT: 45.4 % (ref 39.0–52.0)
Hemoglobin: 15 g/dL (ref 13.0–17.0)
Lymphocytes Relative: 40.2 % (ref 12.0–46.0)
Lymphs Abs: 3 10*3/uL (ref 0.7–4.0)
MCHC: 33.1 g/dL (ref 30.0–36.0)
MCV: 85.8 fl (ref 78.0–100.0)
Monocytes Absolute: 0.6 10*3/uL (ref 0.1–1.0)
Monocytes Relative: 7.3 % (ref 3.0–12.0)
Neutro Abs: 3.7 10*3/uL (ref 1.4–7.7)
Neutrophils Relative %: 49.1 % (ref 43.0–77.0)
Platelets: 258 10*3/uL (ref 150.0–400.0)
RBC: 5.29 Mil/uL (ref 4.22–5.81)
RDW: 14.3 % (ref 11.5–15.5)
WBC: 7.6 10*3/uL (ref 4.0–10.5)

## 2023-01-21 LAB — TSH: TSH: 1.79 u[IU]/mL (ref 0.35–5.50)

## 2023-01-21 LAB — LIPID PANEL
Cholesterol: 206 mg/dL — ABNORMAL HIGH (ref 0–200)
HDL: 34.3 mg/dL — ABNORMAL LOW (ref >39.00)
LDL Cholesterol: 105 mg/dL — ABNORMAL HIGH (ref 0–99)
NonHDL: 172.06
Total CHOL/HDL Ratio: 6
Triglycerides: 337 mg/dL — ABNORMAL HIGH (ref 0.0–149.0)
VLDL: 67.4 mg/dL — ABNORMAL HIGH (ref 0.0–40.0)

## 2023-01-27 ENCOUNTER — Other Ambulatory Visit: Payer: Self-pay | Admitting: Family Medicine

## 2023-01-27 DIAGNOSIS — R0602 Shortness of breath: Secondary | ICD-10-CM

## 2023-02-03 LAB — COLOGUARD: COLOGUARD: NEGATIVE

## 2023-02-05 NOTE — Telephone Encounter (Signed)
Spoke to pt, told pt Duncan's response. Pt had no questions/concerns. Call back # [phone]

## 2023-02-12 ENCOUNTER — Telehealth: Payer: Self-pay | Admitting: *Deleted

## 2023-02-12 NOTE — Telephone Encounter (Signed)
 LMOVM to verify card hx

## 2023-02-16 NOTE — Progress Notes (Unsigned)
Cardiology Office Note  Date:  02/17/2023   ID:  ZAMEER BORMAN, DOB May 07, 1966, MRN [id number]  PCP:  Joaquim Nam, MD   Chief Complaint  Patient presents with   New Patient (Initial Visit)    New patient visit, c/o SOB on exertion. Medications reviewed verbally.     HPI:  Mr. Reshard Guillet is a 57 year old gentleman with past medical history of Hyperlipidemia Former smoker teenager to 62s Who presents by referral from Dr. 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, typically active at baseline Able to exert himself but under heavy exertion, gets more shortness of breath and has to stop  Other hobbies include Painting cars for years Typically takes precautions with a mask on  Denies chest pain concerning for angina No lower extremity swelling, no PND orthopnea  Appears weight gain 20 pounds over the past several years per the notes  Lab work reviewed Total cholesterol 205, LDL 106  EKG personally reviewed by myself on todays visit EKG Interpretation Date/Time:  Wednesday February 17 2023 09:37:08 EDT Ventricular Rate:  57 PR Interval:  168 QRS Duration:  102 QT Interval:  406 QTC Calculation: 395 R Axis:   -10  Text Interpretation: Sinus bradycardia ptherwise normal EKG No previous ECGs available Confirmed by Julien Nordmann [id number]) on 02/17/2023 9:50:48 AM    PMH:   has a past medical history of Elevated blood pressure (not hypertension), Great toe amputation status, Hemorrhoids, Metatarsal fracture, Pilonidal cyst, and Scoliosis.  PSH:   History reviewed. No pertinent surgical history.  No current outpatient medications on file prior to visit.   No current facility-administered medications on file prior to visit.    Allergies:   Patient has no known allergies.   Social History:  The patient  reports that he has quit smoking. His smokeless tobacco use includes chew. He reports that he does not  use drugs.   Family History:   family history includes Bladder Cancer in his father; Depression in his mother; Heart disease in his father; Thyroid cancer in his mother.    Review of Systems: Review of Systems  Constitutional: Negative.   HENT: Negative.    Respiratory: Negative.    Cardiovascular: Negative.   Gastrointestinal: Negative.   Musculoskeletal: Negative.   Neurological: Negative.   Psychiatric/Behavioral: Negative.    All other systems reviewed and are negative.    PHYSICAL EXAM: VS:  BP 126/80 (BP Location: Right Arm, Patient Position: Sitting, Cuff Size: Normal)   Pulse (!) 57   Ht 5\' 10"  (1.778 m)   Wt 242 lb (109.8 kg)   SpO2 98%   BMI 34.72 kg/m  , BMI Body mass index is 34.72 kg/m. GEN: Well nourished, well developed, in no acute distress HEENT: normal Neck: no JVD, carotid bruits, or masses Cardiac: RRR; no murmurs, rubs, or gallops,no edema  Respiratory:  clear to auscultation bilaterally, normal work of breathing GI: soft, nontender, nondistended, + BS MS: no deformity or atrophy Skin: warm and dry, no rash Neuro:  Strength and sensation are intact Psych: euthymic mood, full affect   Recent Labs: 01/21/2023: ALT 30; BUN 15; Creatinine, Ser 1.07; Hemoglobin 15.0; Platelets 258.0; Potassium 4.7; Pro B Natriuretic peptide (BNP) 25.0; Sodium 142; TSH 1.79    Lipid Panel Lab Results  Component Value Date   CHOL 206 (H) 01/21/2023   HDL 34.30 (L) 01/21/2023   LDLCALC 105 (H) 01/21/2023   TRIG 337.0 (H)  01/21/2023      Wt Readings from Last 3 Encounters:  02/17/23 242 lb (109.8 kg)  01/12/23 243 lb (110.2 kg)  04/15/17 218 lb 8 oz (99.1 kg)     ASSESSMENT AND PLAN:  Problem List Items Addressed This Visit     SOBOE (shortness of breath on exertion) - Primary   Relevant Orders   EKG 12-Lead (Completed)    Shortness of breath Etiology unclear, unable to exclude weight gain and conditioning as a contributor Recommended echocardiogram  for further evaluation Calcium scoring as below  Hyperlipidemia Mildly elevated cholesterol Risk stratification study ordered with calcium score Calcium score could help guide management of lipids and whether to start medication  Obesity Weight up 24 pounds over the past several years Likely contributing to cholesterol, possibly to shortness of breath and conditioning Regular walking program and weight loss recommended Reports his wife makes lots of high carbohydrate foods Triglycerides elevated over 300    Total encounter time more than 50 minutes  Greater than 50% was spent in counseling and coordination of care with the patient    Signed, Dossie Arbour, M.D., Ph.D. [facility] [place], [place] [phone]

## 2023-02-17 ENCOUNTER — Ambulatory Visit: Payer: 59 | Attending: Cardiovascular Disease | Admitting: Cardiovascular Disease

## 2023-02-17 ENCOUNTER — Encounter: Payer: Self-pay | Admitting: Cardiovascular Disease

## 2023-02-17 VITALS — BP 126/80 | HR 57 | Ht 70.0 in | Wt 242.0 lb

## 2023-02-17 DIAGNOSIS — R0602 Shortness of breath: Secondary | ICD-10-CM | POA: Diagnosis not present

## 2023-02-17 NOTE — Patient Instructions (Addendum)
Medication Instructions:  No changes  If you need a refill on your cardiac medications before your next appointment, please call your pharmacy.   Lab work: No new labs needed  Testing/Procedures: Your physician has requested that you have an echocardiogram. Echocardiography is a painless test that uses sound waves to create images of your heart. It provides your doctor with information about the size and shape of your heart and how well your heart's chambers and valves are working.   You may receive an ultrasound enhancing agent through an IV if needed to better visualize your heart during the echo. This procedure takes approximately one hour.  There are no restrictions for this procedure.  This will take place at [address] [address], [place] [address]  CT coronary calcium score.   - $99 out of pocket cost at the time of your test - Call [phone] to schedule at your convenience.  [facility] [address] [place], [place] [address]   Coronary CalciumScan A coronary calcium scan is an imaging test used to look for deposits of calcium and other fatty materials (plaques) in the inner lining of the blood vessels of the heart (coronary arteries). These deposits of calcium and plaques can partly clog and narrow the coronary arteries without producing any symptoms or warning signs. This puts a person at risk for a heart attack. This test can detect these deposits before symptoms develop. Tell a health care provider about: Any allergies you have. All medicines you are taking, including vitamins, herbs, eye drops, creams, and over-the-counter medicines. Any problems you or family members have had with anesthetic medicines. Any blood disorders you have. Any surgeries you have had. Any medical conditions you have. Whether you are pregnant or may be pregnant. What are the risks? Generally, this is a safe procedure.  However, problems may occur, including: Harm to a pregnant woman and her unborn baby. This test involves the use of radiation. Radiation exposure can be dangerous to a pregnant woman and her unborn baby. If you are pregnant, you generally should not have this procedure done. Slight increase in the risk of cancer. This is because of the radiation involved in the test. What happens before the procedure? No preparation is needed for this procedure. What happens during the procedure? You will undress and remove any jewelry around your neck or chest. You will put on a hospital gown. Sticky electrodes will be placed on your chest. The electrodes will be connected to an electrocardiogram (ECG) machine to record a tracing of the electrical activity of your heart. A CT scanner will take pictures of your heart. During this time, you will be asked to lie still and hold your breath for 2-3 seconds while a picture of your heart is being taken. The procedure may vary among health care providers and hospitals. What happens after the procedure? You can get dressed. You can return to your normal activities. It is up to you to get the results of your test. Ask your health care provider, or the department that is doing the test, when your results will be ready. Summary A coronary calcium scan is an imaging test used to look for deposits of calcium and other fatty materials (plaques) in the inner lining of the blood vessels of the heart (coronary arteries). Generally, this is a safe procedure. Tell your health care provider if you are pregnant or may be pregnant. No preparation is needed for this  procedure. A CT scanner will take pictures of your heart. You can return to your normal activities after the scan is done. This information is not intended to replace advice given to you by your health care provider. Make sure you discuss any questions you have with your health care provider. Document Released: 10/24/2007  Document Revised: 03/16/2016 Document Reviewed: 03/16/2016 Elsevier Interactive Patient Education  2017 [employer].   Follow-Up: At [facility], you and your health needs are our priority.  As part of our continuing mission to provide you with exceptional heart care, we have created designated Provider Care Teams.  These Care Teams include your primary Cardiologist (physician) and Advanced Practice Providers (APPs -  Physician Assistants and Nurse Practitioners) who all work together to provide you with the care you need, when you need it.  You will need a follow up appointment is needed  Providers on your designated Care Team:   Nicolasa Ducking, NP Eula Listen, PA-C Cadence Fransico [place]ig[place], [place]  COVID-19 Vaccine Information can be found at: [email or web address] For questions related to vaccine distribution or appointments, please email [email or web address] or call [phone].

## 2023-02-24 ENCOUNTER — Other Ambulatory Visit: Payer: 59

## 2023-02-24 ENCOUNTER — Ambulatory Visit
Admission: RE | Admit: 2023-02-24 | Discharge: 2023-02-24 | Disposition: A | Discharge status: Home / Self Care | Payer: 59 | Source: Ambulatory Visit | Attending: Cardiovascular Disease | Admitting: Cardiovascular Disease

## 2023-02-24 DIAGNOSIS — R0602 Shortness of breath: Secondary | ICD-10-CM | POA: Insufficient documentation

## 2023-03-01 MED ORDER — ROSUVASTATIN CALCIUM 10 MG PO TABS: 10.0000 mg | ORAL_TABLET | Freq: Every day | ORAL | 3 refills | Status: DC

## 2023-03-01 NOTE — Telephone Encounter (Signed)
Called and spoke with patient. Notified him of the following from Dr. Mariah Milling.   CT calcium score Mildly elevated coronary calcification 214 Would recommend we start Crestor 10 mg daily to achieve goal total cholesterol less than 150 Currently number is 200  Patient verbalizes understanding. Prescription sent to preferred pharmacy.

## 2023-03-08 ENCOUNTER — Ambulatory Visit: Payer: 59 | Attending: Cardiovascular Disease

## 2023-03-08 DIAGNOSIS — R0602 Shortness of breath: Secondary | ICD-10-CM | POA: Diagnosis not present

## 2023-03-08 LAB — ECHOCARDIOGRAM COMPLETE
Area-P 1/2: 3.72 cm2
S' Lateral: 2.5 cm

## 2024-03-18 ENCOUNTER — Other Ambulatory Visit: Payer: Self-pay | Admitting: Cardiovascular Disease

## 2024-04-27 ENCOUNTER — Other Ambulatory Visit: Payer: Self-pay | Admitting: Cardiovascular Disease

## 2024-06-02 ENCOUNTER — Other Ambulatory Visit: Payer: Self-pay | Admitting: Family Medicine

## 2024-06-02 DIAGNOSIS — E785 Hyperlipidemia, unspecified: Secondary | ICD-10-CM

## 2024-06-06 ENCOUNTER — Other Ambulatory Visit (INDEPENDENT_AMBULATORY_CARE_PROVIDER_SITE_OTHER)

## 2024-06-06 DIAGNOSIS — E785 Hyperlipidemia, unspecified: Secondary | ICD-10-CM | POA: Diagnosis not present

## 2024-06-06 LAB — COMPREHENSIVE METABOLIC PANEL WITH GFR
ALT: 33 U/L (ref 3–53)
AST: 20 U/L (ref 5–37)
Albumin: 4.9 g/dL (ref 3.5–5.2)
Alkaline Phosphatase: 84 U/L (ref 39–117)
BUN: 16 mg/dL (ref 6–23)
CO2: 29 meq/L (ref 19–32)
Calcium: 10 mg/dL (ref 8.4–10.5)
Chloride: 103 meq/L (ref 96–112)
Creatinine, Ser: 1 mg/dL (ref 0.40–1.50)
GFR: 83.55 mL/min
Glucose, Bld: 106 mg/dL — ABNORMAL HIGH (ref 70–99)
Potassium: 5.2 meq/L — ABNORMAL HIGH (ref 3.5–5.1)
Sodium: 142 meq/L (ref 135–145)
Total Bilirubin: 0.6 mg/dL (ref 0.2–1.2)
Total Protein: 7.9 g/dL (ref 6.0–8.3)

## 2024-06-06 LAB — LIPID PANEL
Cholesterol: 160 mg/dL (ref 28–200)
HDL: 38.1 mg/dL — ABNORMAL LOW
LDL Cholesterol: 92 mg/dL (ref 10–99)
NonHDL: 122.38
Total CHOL/HDL Ratio: 4
Triglycerides: 154 mg/dL — ABNORMAL HIGH (ref 10.0–149.0)
VLDL: 30.8 mg/dL (ref 0.0–40.0)

## 2024-06-07 ENCOUNTER — Ambulatory Visit: Payer: Self-pay | Admitting: Family Medicine

## 2024-06-08 ENCOUNTER — Other Ambulatory Visit

## 2024-06-15 ENCOUNTER — Encounter: Payer: Self-pay | Admitting: Family Medicine

## 2024-06-15 ENCOUNTER — Ambulatory Visit: Admitting: Family Medicine

## 2024-06-15 VITALS — BP 138/82 | HR 75 | Temp 98.5°F | Ht 68.5 in | Wt 241.2 lb

## 2024-06-15 DIAGNOSIS — R0602 Shortness of breath: Secondary | ICD-10-CM

## 2024-06-15 NOTE — Patient Instructions (Signed)
 Let me know if you can't get an appointment with pulmonary.  I would get a flu shot each fall.   See if you can get one at the pharmacy tomorrow.   Stop crestor  for 2 weeks.  If better and less aches, let me know.  If not, then restart med and update me as needed.   Thanks for your effort.  Take care.  Glad to see you.

## 2024-06-15 NOTE — Progress Notes (Unsigned)
 CPE- See plan.  Routine anticipatory guidance given to patient.  See health maintenance.  The possibility exists that previously documented standard health maintenance information may have been brought forward from a previous encounter into this note.  If needed, that same information has been updated to reflect the current situation based on today's encounter.    Tetanus 2024 Flu d/w pt. He can get it at the pharmacy.   PNA d/w pt.   shingles d/w pt.   Covid prev done.   Cologuard neg 2024 Prostate cancer screening and PSA options (with potential risks and benefits of testing vs not testing) were discussed along with recent recs/guidelines.  He declined testing PSA at this point. Living will d/w pt.  Wife designated if patient were incapacitated.   HIV and HCV screening d/w pt.  [facility].    His father has cancer.   His mother has colonoscopy and EGD.  She had colon polyps but not cancer.  His father in law needs medical help, esp re: diabetes.  FIL needed to be in [place].  Stressors d/w pt but he is able to manage as is.  No SI/HI.   CAD d/w pt.  He has exertional SOB, with doing chores at a slower pace now.  D/w pt about prev CAD testing.  He is on statin.  Some knee and hand aches episodically.  Unclear of statin related.    Some occ AM cough, at baseline.   D/w pt about occ LUTS that may be caffeine responsive.  D/w pt about caffeine taper.    PMH and SH reviewed  Meds, vitals, and allergies reviewed.   ROS: Per HPI.  Unless specifically indicated otherwise in HPI, the patient denies:  General: fever. Eyes: acute vision changes ENT: sore throat Cardiovascular: chest pain Respiratory: SOB GI: vomiting GU: dysuria Musculoskeletal: acute back pain Derm: acute rash Neuro: acute motor dysfunction Psych: worsening mood Endocrine: polydipsia Heme: bleeding Allergy: hayfever  GEN: nad, alert and oriented HEENT: mucous membranes moist NECK: supple w/o  LA CV: rrr. PULM: ctab, no inc wob ABD: soft, +bs EXT: no edema SKIN: no acute rash

## 2024-06-27 ENCOUNTER — Ambulatory Visit: Admitting: Pulmonary Disease

## 2025-06-11 ENCOUNTER — Other Ambulatory Visit

## 2025-06-18 ENCOUNTER — Encounter: Admitting: Family Medicine
